# Patient Record
Sex: Male | Born: 1965 | Race: White | Hispanic: No | Marital: Married | Smoking: Current every day smoker
Health system: Southern US, Community
[De-identification: ages and names within clinical notes are randomized; demographics above are authoritative.]

## PROBLEM LIST (undated history)

## (undated) DIAGNOSIS — F1120 Opioid dependence, uncomplicated: Secondary | ICD-10-CM

## (undated) DIAGNOSIS — F1920 Other psychoactive substance dependence, uncomplicated: Secondary | ICD-10-CM

## (undated) HISTORY — PX: SPLENECTOMY, TOTAL: SHX788

## (undated) HISTORY — PX: APPENDECTOMY: SHX54

## (undated) HISTORY — PX: CHOLECYSTECTOMY: SHX55

---

## 1999-12-03 ENCOUNTER — Ambulatory Visit (HOSPITAL_COMMUNITY): Admission: RE | Admit: 1999-12-03 | Discharge: 1999-12-03 | Payer: Self-pay | Admitting: Unknown Physician Specialty

## 2000-01-12 ENCOUNTER — Ambulatory Visit (HOSPITAL_COMMUNITY): Admission: RE | Admit: 2000-01-12 | Discharge: 2000-01-12 | Payer: Self-pay | Admitting: Sports Medicine

## 2000-01-12 ENCOUNTER — Encounter: Payer: Self-pay | Admitting: Sports Medicine

## 2000-08-23 ENCOUNTER — Encounter: Payer: Self-pay | Admitting: Orthopedic Surgery

## 2000-08-25 ENCOUNTER — Encounter: Payer: Self-pay | Admitting: Orthopedic Surgery

## 2000-08-25 ENCOUNTER — Ambulatory Visit (HOSPITAL_COMMUNITY): Admission: RE | Admit: 2000-08-25 | Discharge: 2000-08-25 | Payer: Self-pay | Admitting: Orthopedic Surgery

## 2004-06-17 ENCOUNTER — Ambulatory Visit: Payer: Self-pay | Admitting: Psychiatry

## 2004-06-17 ENCOUNTER — Inpatient Hospital Stay (HOSPITAL_COMMUNITY): Admission: RE | Admit: 2004-06-17 | Discharge: 2004-06-24 | Payer: Self-pay | Admitting: Psychiatry

## 2006-08-10 ENCOUNTER — Emergency Department (HOSPITAL_COMMUNITY): Admission: EM | Admit: 2006-08-10 | Discharge: 2006-08-10 | Payer: Self-pay | Admitting: Emergency Medicine

## 2006-08-11 ENCOUNTER — Inpatient Hospital Stay (HOSPITAL_COMMUNITY): Admission: RE | Admit: 2006-08-11 | Discharge: 2006-08-17 | Payer: Self-pay | Admitting: Psychiatry

## 2006-08-11 ENCOUNTER — Ambulatory Visit: Payer: Self-pay | Admitting: Psychiatry

## 2009-03-24 ENCOUNTER — Emergency Department (HOSPITAL_BASED_OUTPATIENT_CLINIC_OR_DEPARTMENT_OTHER): Admission: EM | Admit: 2009-03-24 | Discharge: 2009-03-24 | Payer: Self-pay | Admitting: Emergency Medicine

## 2009-07-08 ENCOUNTER — Emergency Department (HOSPITAL_BASED_OUTPATIENT_CLINIC_OR_DEPARTMENT_OTHER): Admission: EM | Admit: 2009-07-08 | Discharge: 2009-07-08 | Payer: Self-pay | Admitting: Emergency Medicine

## 2009-07-10 ENCOUNTER — Telehealth: Payer: Self-pay | Admitting: Family

## 2009-07-10 ENCOUNTER — Ambulatory Visit: Payer: Self-pay | Admitting: Diagnostic Radiology

## 2009-07-10 ENCOUNTER — Ambulatory Visit: Payer: Self-pay | Admitting: Family

## 2009-07-10 ENCOUNTER — Ambulatory Visit (HOSPITAL_BASED_OUTPATIENT_CLINIC_OR_DEPARTMENT_OTHER): Admission: RE | Admit: 2009-07-10 | Discharge: 2009-07-10 | Payer: Self-pay | Admitting: Family Medicine

## 2009-07-10 DIAGNOSIS — M542 Cervicalgia: Secondary | ICD-10-CM

## 2009-07-10 DIAGNOSIS — M549 Dorsalgia, unspecified: Secondary | ICD-10-CM | POA: Insufficient documentation

## 2009-07-15 ENCOUNTER — Telehealth: Payer: Self-pay | Admitting: Family

## 2009-09-13 ENCOUNTER — Ambulatory Visit: Payer: Self-pay | Admitting: Family

## 2010-05-13 NOTE — Progress Notes (Signed)
Summary: pain clinic request  Phone Note Call from Patient   Caller: Patient Summary of Call: Pt would like to be referred to [facility] [place] #[phone] as it is closer to his home.  Mervin Kung CMA  July 10, 2009 1:47 PM  Initial call taken by: Lemont Fillers FNP,  July 10, 2009 1:50 PM  Follow-up for Phone Call        Pls note pt's request for specific pain clinic referral. Thanks Follow-up by: Lemont Fillers FNP,  July 10, 2009 1:50 PM  Additional Follow-up for Phone Call Additional follow up Details #1::        Unable to locate Pain Clinic patient requesting.  I s/w patient, he is aware & agreed that I refer him to [facility].  We are awaiting response about appt. Additional Follow-up by: Magdalen Spatz [facility],  July 12, 2009 1:57 PM

## 2010-05-13 NOTE — Progress Notes (Signed)
Summary: xray results  Phone Note Outgoing Call   Summary of Call: Please call patient and let him know that his neck x-ray is normal. There is note of some mild disc degeneration of the lumbar spine.  Also pls send patient the pt info re: payment plan at [facility] which may help him spread out or decrease his payment.  Alternatively, he could try [facility] as they work with patient's who are uninsured. Initial call taken by: Lemont Fillers FNP,  July 10, 2009 11:58 AM  Follow-up for Phone Call        Notified pt. of neck and back xray results.  Advised pt. I would mail info. to him re: payment plans through [facility].  Pt. states he has info. and numbers for [facility] in [facility] and [facility] in [place] if he determines he will not be able to continue his care with [place].  Mervin Kung CMA  July 10, 2009 1:35 PM

## 2010-05-13 NOTE — Progress Notes (Signed)
Summary: running low on meds--lm  Phone Note Call from Patient Call back at Home Phone [phone]   Caller: Patient Call For: Mehdi Gironda  Summary of Call: We have faxed information to Pain Management in [place] and have not heard back from them for an appt.  The patient called gave him their number to see if he calls them if they would go ahead with an appt for him.  He is running low on meds.   Initial call taken by: Roselle Locus,  July 15, 2009 3:42 PM  Follow-up for Phone Call        I agreed to provide patient with a one time prescription for narcotics.  X-rays are negative except for some mild disc degeneration in his lumbar spine.  Given these results I will defer any further narcotics to pain management when they see him.  In the meantime I recommend that the patient try mobic.  I have sent this to his pharmacy.   Follow-up by: Lemont Fillers FNP,  July 15, 2009 4:08 PM  Additional Follow-up for Phone Call Additional follow up Details #1::        Left message on machine to return call.   Mervin Kung CMA  July 16, 2009 11:50 AM   Notified pt. per Efraim Kaufmann that she would not be filling any further rx for narcotic pain meds.  He would need to get further rx from pain management.  Pt. could try Mobic. Pt states his appt. with pain management in 07/31/09 and he would just take Ibuprofen as Mobic didn't seem to help.  Nicki Guadalajara Fergerson CMA  July 17, 2009 8:39 AM     New/Updated Medications: MOBIC 7.5 MG TABS (MELOXICAM) one tablet by mouth daily as needed pain Prescriptions: MOBIC 7.5 MG TABS (MELOXICAM) one tablet by mouth daily as needed pain  #30 x 0   Entered and Authorized by:   Lemont Fillers FNP   Signed by:   Lemont Fillers FNP on 07/15/2009   Method used:   Electronically to        [facility] (retail)       [phone] [address]       [place], [place]  [id number]       Ph: [phone]       Fax: [phone]  RxID:   [id number]

## 2010-05-13 NOTE — Assessment & Plan Note (Signed)
Summary: new to est er follow up/mhf   Vital Signs:  Patient profile:   45 year old male Height:      67 inches Weight:      161.38 pounds BMI:     25.37 Temp:     97.5 degrees F oral Pulse rate:   72 / minute Pulse rhythm:   regular Resp:     12 per minute BP sitting:   118 / 88  (right arm) Cuff size:   regular  Vitals Entered By: Mervin Kung CMA (July 10, 2009 9:13 AM)  Is Patient Diabetic? No   History of Present Illness: Mr Kenneth Anderson is a 45 year old male who presents today to establish care.  Tells me that he felt a "huge pop" in his neck while trying to move a dining table at work.  This occured on 3/28. He works for [employer].  He was seen over at the [facility] ED.  He notes that he has a he has history of chronic neck and back pain, but notes that his neck pain acutely worsened on 3/28 following this injury at work.  He was prescribed prednisone, ibuprofen, valium and oxycodone in the ED. Patient reports that he has pain at the base of his neck which radiates to his shoulders and up his head causing a headache.  Also notes back pain in his lower back which occasionally radiates down both legs.  Notes that his feet occasionally go "numb".  Notes that his legs will "buckle" for now reason at all.  Though denies LE weakness.  Denies UE weakness.  Does note some finger numbness at times which seems positional.  Notes that these medications are not helping his pain.       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 Resp: Denies cough CV:  Denies Chest Pain GI:  Denies nausea or vomitting GU: Denies dysuria Lymphatic: Denies lymphadenopathy Musculoskeletal:  see HPI Skin:  Denies Rashes Psychiatric: notes + depression which is situational related to back pain and lack of insurance, has been treated in the past, declines meds at this time. Neuro: see HPI    Physical Exam  General:  Well-developed,well-nourished,in no acute distress; alert,appropriate and cooperative throughout examination Lungs:  Normal respiratory effort, chest expands symmetrically. Lungs are clear to auscultation, no crackles or wheezes. Heart:  Normal rate and regular rhythm. S1 and S2 normal without gallop, murmur, click, rub or other extra sounds.   Impression & Recommendations:  Problem # 1:  NECK PAIN (ICD-723.1) Assessment New Will check plain films of neck.  I will refer to pain clinic  given acute on chronic nature of his pain.  I did tell the patient that I will give him a one time prescription for oxycodone and then defer future pain management to pain clinic.   His updated medication list for this problem includes:    Aleve 220 Mg Tabs (Naproxen sodium) ..... One tablet by mouth two times a day with food as needed for pain    Oxycodone-acetaminophen 7.5-325 Mg Tabs (Oxycodone-acetaminophen) .Marland Kitchen... 1  tablet every 6 hours as needed.  Orders: T-Cervical Spine Comp 4 Views (72050TC) Pain Clinic Referral (Pain)  Problem # 2:  BACK PAIN (ICD-724.5) Assessment: New Will check plain films of back.   His updated medication list for this problem includes:    Aleve 220 Mg Tabs (Naproxen sodium) ..... One tablet by mouth two times a day with food as needed for pain    Oxycodone-acetaminophen 7.5-325 Mg Tabs (Oxycodone-acetaminophen) .Marland Kitchen... 1 tablet every 6 hours as needed.  Orders: T-Thoracic Spine 2 Views [id number]) T-Lumbar Spine 2 Views (72100TC) Pain Clinic Referral (Pain)  Complete Medication List: 1)  Aleve 220 Mg Tabs (Naproxen sodium) .... One tablet by mouth two times a day with food as needed for pain 2)  Valium 5 Mg Tabs (Diazepam) .Marland Kitchen.. 1 tablet every 6 hours as  needed 3)  Oxycodone-acetaminophen 7.5-325 Mg Tabs (Oxycodone-acetaminophen) .Marland Kitchen.. 1 tablet every 6 hours as needed. 4)  Prednisone 20 Mg Tabs (Prednisone) .... As directed  Patient Instructions: 1)  Please complete x-rays today. 2)  Follow up in 2 weeks. Prescriptions: OXYCODONE-ACETAMINOPHEN 7.5-325 MG TABS (OXYCODONE-ACETAMINOPHEN) 1 tablet every 6 hours as needed.  #30 x 0   Entered and Authorized by:   Lemont Fillers FNP   Signed by:   Lemont Fillers FNP on 07/10/2009   Method used:   Print then Give to Patient   RxID:   [id number]    Vital Signs:  Patient Profile:   45 year old male Height:     67 inches Weight:      161.38 pounds BMI:     25.37 Temp:     97.5 degrees F oral Pulse rate:   72 / minute Pulse rhythm:   regular Resp:     12 per minute BP sitting:   118 / 88 Cuff size:   regular                 Current Allergies (reviewed today): No known allergies

## 2010-07-07 ENCOUNTER — Emergency Department (HOSPITAL_BASED_OUTPATIENT_CLINIC_OR_DEPARTMENT_OTHER)
Admission: EM | Admit: 2010-07-07 | Discharge: 2010-07-07 | Disposition: A | Discharge status: Home / Self Care | Payer: Self-pay | Attending: Emergency Medicine | Admitting: Emergency Medicine

## 2010-07-07 DIAGNOSIS — F17200 Nicotine dependence, unspecified, uncomplicated: Secondary | ICD-10-CM | POA: Insufficient documentation

## 2010-07-07 DIAGNOSIS — K047 Periapical abscess without sinus: Secondary | ICD-10-CM | POA: Insufficient documentation

## 2010-08-29 NOTE — H&P (Signed)
NAME:  Kenneth Anderson, Kenneth Anderson NO.:  [id number]   MEDICAL RECORD NO.:  [id number]          PATIENT TYPE:  IPS   LOCATION:  0500                          FACILITY:  [facility]   PHYSICIAN:  Geoffery Lyons, M.D.      DATE OF BIRTH:  Mar 26, 1966   DATE OF ADMISSION:  08/11/2006  DATE OF DISCHARGE:                       PSYCHIATRIC ADMISSION ASSESSMENT   A 45 year old, white male voluntarily admitted on August 11, 2006.   HISTORY OF PRESENT ILLNESS:  The patient presents with a history of  alcohol abuse and cocaine use.  He states he has been drinking too  much, using cocaine on occasion, with some depression, and suicidal  thoughts.  No specific plan.  The patient has been drinking 6-8 beers  daily.  Has been using cocaine for approximately 1 month.  No IV drug  use.  Denies any other drug use.   PAST PSYCHIATRIC HISTORY:  The patient was here in 2006 for suicidal  thoughts and substance abuse.  He has a history of bipolar disorder.  He  sees Dr. Micheline Maze for outpatient psychiatric care.   SOCIAL HISTORY:  A 45 year old, white male who appears to be unemployed.   FAMILY HISTORY:  None known.   ALCOHOL/DRUG HISTORY:  As above.  The patient apparently has had a  seizure with cocaine use in the past.   PRIMARY CARE Tahesha Skeet:  None listed.   MEDICAL PROBLEMS:  Per chart.  Again a history of a seizure after  cocaine use and a history of __________.   MEDICATIONS:  He has been on Depakote 500 mg daily and Celexa 20 mg  daily.  Some questionable noncompliance as his Depakote level is less  than 10.   DRUG ALLERGIES:  NO KNOWN ALLERGIES.   PHYSICAL EXAMINATION:  The patient was fully assessed at [facility]  Emergency Department.  His temperature is 97.7, 102 heart rate, 20  respirations, blood pressure 134/89, 97% saturated, 71.82 kg.  His  alcohol level is 14.  BMET is within normal limits.  WBC count is  elevated at 15.9.  Urine drug screen is positive for cocaine and  positive  for cannabis.  Salicylate  level less than 4.  Valproic acid  level less than 10.  Urinalysis was negative.   PHYSICAL FINDINGS:  GENERAL:  This is a young male in no acute distress.  Denies any complaints.  He is resting in bed.  MENTAL STATUS EXAM:  The patient is resting in bed.  His eyes are  closed.  The patient provides only a few words.  The patient feels  tired.  The patient does appear sleepy.  Thought processes are coherent.  He can provide brief answers, but answers are goal directed and no  evidence of delusional thinking.  Cognitive function intact.  His memory  is fair.  Judgment and insight is fair.  Poor historian.   AXIS I:  Alcohol abuse, rule out dependence, cocaine abuse, depressive  disorder not otherwise specified.  AXIS II:  Deferred.  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  AXIS IV:  Problems with primary support group, other psychosocial  problems, possible problems related to housing.  AXIS V:  Current is 30, 35.   PLAN:  Contract for safety.  Stabilize his mood and thinking.  We will  resume his medications.  I will monitor Depakote level, put the patient  on a Librium protocol, and work on relapse prevention.  The patient is  to attend group therapy.  Case manager is to assess his living situation  and potential rehab programs.  We will reinforce medication compliance  and consider family session with the patient's support group.  We will  continue to obtain more information as the patient is able to interview  more readily.  Tentative length of stay is 3-5 days.      Landry Corporal, N.P.      Geoffery Lyons, M.D.  Electronically Signed    JO/MEDQ  D:  08/11/2006  T:  08/11/2006  Job:  [id number]
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
## 2010-08-29 NOTE — Discharge Summary (Signed)
NAME:  Kenneth Anderson, LANDSTROM NO.:  [id number]   MEDICAL RECORD NO.:  [id number]          PATIENT TYPE:  IPS   LOCATION:  0500                          FACILITY:  [facility]   PHYSICIAN:  Geoffery Lyons, M.D.      DATE OF BIRTH:  May 05, 1965   DATE OF ADMISSION:  08/11/2006  DATE OF DISCHARGE:  08/17/2006                               DISCHARGE SUMMARY   CHIEF COMPLAINT AND PRESENT ILLNESS:  This was the second admission to  [facility] for this 45 year old white male voluntarily  admitted.  A history of alcohol abuse and cocaine abuse.  He claimed he  had been drinking too much, using cocaine on occasion, with some  depression and suicidal thoughts.  No specific plan.  He has been  drinking 6-8 beers.  Also using cocaine for approximately 1 month.  He  denied any other drug use.   PAST PSYCHIATRIC HISTORY:  He was admitted to this unit in 2006 for  suicidal thoughts and substance abuse.  Diagnosed with bipolar disorder.  Seeing Dr. Micheline Maze in an outpatient clinic.   SECONDARY HISTORY:  Persistent use of alcohol and cocaine.  Has had a  seizure secondary to cocaine use in the past.   MEDICAL HISTORY:  Noncontributory.   MEDICATIONS:  1. Depakote 500 mg per day.  2. Celexa 20 mg per day.  3. Questionable compliance with Depakote level was less than 10.   PHYSICAL EXAMINATION:  Performed, it failed to show any acute findings.   LABORATORY WORKUP:  SGOT 25, SGPT 19, total bilirubin 1.1.  BMET within  normal limits.  White blood cells 15.9.  Urine drug screen positive for  cocaine and marijuana.  Valproic acid level less than 10.   ADMITTING DIAGNOSES:  AXIS I:  Depressive disorder, not otherwise  specified.  Alcohol, cocaine, marijuana abuse.  AXIS II:  No diagnosis.  AXIS III:  No diagnosis.  AXIS IV:  Moderate.  AXIS V:  Upon admission, 35; highest global assessment of functioning in  the last year 60.   COURSE IN THE HOSPITAL:  He was admitted.  He was  started in individual  and group psychotherapy.  He had been on Celexa 20 mg per day and  Depakote 500 mg per day.  We went ahead and detoxed with Librium, used  trazodone for sleep, and started him on Depakote 500 twice daily and  Celexa 20 mg per day.  Endorsed he said that he was very depressed,  tired of drinking alcohol 6-10 beers every day for several weeks,  cocaine once or twice per month, marijuana every day.  He endorsed  anxiety.  He had been diagnosed bipolar.  He had been on Seroquel,  Celexa, Klonopin.  He works in Building [employer].  Over the last 5 months, he  endorsed that he likes his job.  Divorced.  He has three children; 16,  8, and 3.  The main cause of the divorce was his addiction.  There was a  family session with his fiancee.  She seemed to be supportive, concerned  about what kind of followup he was  going to have and claimed that he was  going to be compliant with treatment.  On May 4, he was feeling better;  it seemed that Librium that we were using for detox was very sedating.  Once he was off the Librium, he was overall even better.  Depakote was  switched to bedtime only.  He continued to improve.  On May 6, he was in  full contact with reality.  There were no active suicidal or homicidal  ideas, no hallucinations or delusions.  There was no withdrawal.  He was  pretty clear cognitively-wise.  He endorsed that he was going to be  committed to abstain from using any substances and going to stay on his  medication.   DISCHARGE DIAGNOSES:  AXIS I:  Alcohol, cocaine, marijuana abuse,  bipolar disorder.  AXIS II:  No diagnosis.  AXIS III:  No diagnosis.  AXIS IV:  Moderate.  AXIS V:  Upon discharge, 50.  Discharged on Celexa 20 mg per day,  Seroquel 100 mg at night, and trazodone 100 mg at night.  Followup  Tiffany __________ [facility] in [place].      Geoffery Lyons, M.D.  Electronically Signed     IL/MEDQ  D:  09/10/2006  T:  09/11/2006  Job:  [id number]

## 2010-08-29 NOTE — Discharge Summary (Signed)
NAME:  Kenneth Anderson, Kenneth Anderson NO.:  [id number]   MEDICAL RECORD NO.:  [id number]          PATIENT TYPE:  IPS   LOCATION:  0502                          FACILITY:  [facility]   PHYSICIAN:  Geoffery Lyons, M.D.      DATE OF BIRTH:  1965-10-14   DATE OF ADMISSION:  06/17/2004  DATE OF DISCHARGE:  06/24/2004                                 DISCHARGE SUMMARY   CHIEF COMPLAINT AND PRESENT ILLNESS:  This was the first admission to [facility] for this 45 year old married white male voluntarily  admitted.  History of crack cocaine.  Relapsed about two weeks prior to this  admission after being clean for five months.  Has been drinking alcohol ever  since Sunday.  Has been feeling depressed and having suicidal thoughts with  a plan to cut himself, hang himself or throw himself into traffic.  His  deterrent was his family.  History of alcohol and drugs for approximately  two years.  Has not been sleeping since wife kicked him out of the house  because of his substance use.   PAST PSYCHIATRIC HISTORY:  First time at [facility].  History of  bipolar disorder, type 2.  Outpatient at [facility].   ALCOHOL/DRUG HISTORY:  As already stated, persistent use of substances.  Drinking 4-5 beers a day, drinking in the morning, denied any withdrawal,  using crack cocaine.   MEDICAL HISTORY:  Seizures after cocaine use, ___________, history of  splenectomy.   MEDICATIONS:  Had been on lithium and Seroquel, noncompliant.   PHYSICAL EXAMINATION:  Performed and failed to show any acute findings.   LABORATORY DATA:  Urine drug screen positive for cocaine and marijuana.  CBC  within normal limits.  Liver enzymes within normal limits.  TSH 1.303.  Lithium level less than 0.25.   MENTAL STATUS EXAM:  Alert, cooperative male with little eye contact.  Casually dressed.  Speech was soft-spoken, not pressured, normal rate and  tone.  Feeling hopeless, helpless, sad, some mild  irritability.  Thought  processes were logical, coherent and relevant.  No evidence of psychosis.  Answers are goal directed.  Cognition was well-preserved.   ADMISSION DIAGNOSES:   AXIS I:  1.  Bipolar disorder, type 2, depressed.  2.  Polysubstance abuse.   AXIS II:  No diagnosis.   AXIS III:  1.  ___________.  2.  Status post splenotomy.  3.  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   AXIS IV:  Moderate.   AXIS V:  Global Assessment of Functioning upon admission 30; highest Global  Assessment of Functioning in the last year 65.   HOSPITAL COURSE:  He was admitted and started in individual and group  psychotherapy.  He was given Ambien for sleep.  He was given Seroquel 100 mg  at night.  He was given Seroquel 50 mg every six hours as needed, Librium 25  mg every six hours for withdrawal, Symmetrel 100 mg twice a day.  He was  given trazodone 50 mg at night and Vistaril 25 mg every four hours as  needed.  Ambien was discontinued.  Placed on Eskalith 450 mg daily.  Seroquel was increased to 150 mg at night and he was given Lexapro 5 mg at  night.  Admitted depressed and suicidal, arguing with his wife with no place  to live.  Suicidal ideation but no plan.  Seroquel was adjusted further.  He  was endorsing irritability, racing thoughts, mood swings, decreased sleep.  Seroquel was increased to 250 mg per night and 50 mg twice a day.  Still  difficult time with sleep.  Complaining of being irritable and having  difficulty with nerves.  He was started on Neurontin 300 mg every three  hours.  Feeling that the Neurontin helped.  He was able to sleep better.  Less jerking.  On March 13th, he was still sleeping well.  Wanting to go to  a residential treatment center as he felt he was not going to be able to  make it.  He was placed on a waiting list for [facility] and [facility].  On  March 14th, he was in full contact with reality.  Endorsed no suicidal  ideation, no homicidal ideation, no  hallucinations, no delusions.  Insightful.  One of the losses due to his substance use.  Was going to be  staying with his pastor.  Was going to wait for a bed at [facility].  We continued  outpatient treatment.  He was willing and motivated to pursue further work  on his abstinence.   DISCHARGE DIAGNOSES:   AXIS I:  1.  Bipolar disorder, type 2, depressed.  2.  Polysubstance abuse.   AXIS II:  No diagnosis.   AXIS III:  1.  __________.  2.  Status post splenectomy.  3.  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   AXIS IV:  Moderate.   AXIS V:  Global Assessment of Functioning upon discharge 55.   DISCHARGE MEDICATIONS:  1.  Symmetrel 100 mg twice a day.  2.  Eskalith CR 450 mg, 1 twice a day.  3.  Lexapro 10 mg per day.  4.  Seroquel 25 mg, 2 twice a day.  5.  Neurontin 300 mg, 1 three times a day and 2 at night.  6.  Seroquel 300 mg at night.   FOLLOW UP:  [facility].      IL/MEDQ  D:  07/16/2004  T:  07/16/2004  Job:  [id number]

## 2010-08-29 NOTE — H&P (Signed)
Kenneth Anderson, Kenneth Anderson           ACCOUNT NO.:  [id number]   MEDICAL RECORD NO.:  [id number]          PATIENT TYPE:  IPS   LOCATION:  0502                          FACILITY:  [facility]   PHYSICIAN:  Syed T. Arfeen, M.D.   DATE OF BIRTH:  08-Jun-1965   DATE OF ADMISSION:  06/17/2004  DATE OF DISCHARGE:                         PSYCHIATRIC ADMISSION ASSESSMENT   IDENTIFYING INFORMATION:  Patient is a 45 year old married white male  voluntarily admitted on 06/17/04.   HISTORY OF PRESENT ILLNESS:  Patient presents with a history of crack  cocaine use, relapsed about 2 weeks ago after being clean for 5 months.  He  also has been drinking alcohol ever since Sunday.  He has been feeling  depressed and having suicidal thoughts with a plan to cut himself, hang  himself or throw himself into traffic.  Patient states that his deterrent  was his family.  He has a history of alcohol and drug use for approximately  2 years.  He has not been sleeping.  His appetite has been satisfactory.  He  has lost about 2 pounds.  He denies any psychotic symptoms.  He states his  wife kicked him out of the house because of his substance abuse.  Patient is  currently seeking help.   PAST PSYCHIATRIC HISTORY:  First admission to [facility]  He  has a history of bipolar II and is an outpatient at [facility].  His longest history of sobriety has been 5 months.  He has no  history of being detoxed prior.   SOCIAL HISTORY:  This is a 45 year old married white male, married for 15  years with 3 children, ages 73, 67 and 2 months old.  He lives with his wife  and 3 children.  He has been unemployed for the past 2 weeks.  He was fired  from his position.  He was doing Geneticist, molecular.   FAMILY HISTORY:  Unclear.   ALCOHOL OR DRUG HISTORY:  Patient smokes.  He has been drinking 4-5 beers a  day.  Drinks in the morning.  He denies any withdrawal symptoms.  He has no  history of seizures.   He denies any IV drug use and has been using crack  cocaine.   PRIMARY CARE Destinee Taber:  None.   MEDICAL PROBLEMS:  Spherocytosis which he says is an abnormality of his  white blood count.  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  He also reports a  history of seizures after cocaine use.  Patient is currently on any  medications in regards to this and did not followup in regards to seizure  activity.   MEDICATIONS:  He has been on lithium and Seroquel, and has been noncompliant  with his medicines.   DRUG ALLERGIES:  No known drug allergies.   PHYSICAL EXAMINATION:  Patient was assessed at [facility] where he received thiamine injection.  He is in no acute distress  today.  His temperature is 98.6, heart rate 108, respirations 16, blood  pressure 111/68, 5 feet 7 inches tall.  He is 163 pounds.  LABORATORY DATA:  His urine drug screen is positive for cocaine, positive  for THC.  TSH is 1.303.  CBC shows a platelet count elevated at 430.  Liver  enzymes are within normal limits.  Urinalysis shows positive nitrates.  WBC  count is 0-2.  He also has trace ketones.   MENTAL STATUS EXAM:  He is an alert, cooperative male.  Little eye contact.  He is casually dressed.  His speech is soft spoken.  No pressure, normal  rate and tone.  Patient feels hopeless.  The patient is sad and shows some  mild irritability.  Thought processes are coherent.  There appears to be no  evidence of psychosis.  His answers are goal directed.  Cognitive function  intact.  Memory is fair.  Judgment is fair.  Insight is limited.  Poor  impulse control.   ADMISSION DIAGNOSES:   AXIS I:  1.  Bipolar II, depressed.  2.  Polysubstance abuse, rule out dependence.   AXIS II:  Deferred.   AXIS III:  Spherocytosis.   AXIS IV:  Problems with primary support group, housing, occupation, other  psychosocial problems.   AXIS V:  Current is 30, estimated in the past year 82.   PLAN:   Stabilize mood and thinking.  We will detox off alcohol, monitor  withdrawal symptoms.  Put patient on Symmetrel for cocaine abuse.  Work on  relapse prevention.  Patient is to increase coping skills.  We will resume  his lithium and Seroquel, reinforce medication compliance, will monitor  levels of lithium for therapeutic range.  We will have a family session with  wife for support.  Patient is to follow up with AA, NA and consider the CD  IOP program.  Tentative length of stay is 4-6 days.      JO/MEDQ  D:  06/20/2004  T:  06/20/2004  Job:  [id number]

## 2011-04-04 IMAGING — CR DG LUMBAR SPINE 2-3V
3 series · 3 of 3 positions shown · non-contrast
Comparison: None.

CLINICAL DATA: Back pain.  Lifting injury.

LUMBAR SPINE - 2-3 VIEW

[t l-spine a.p.]
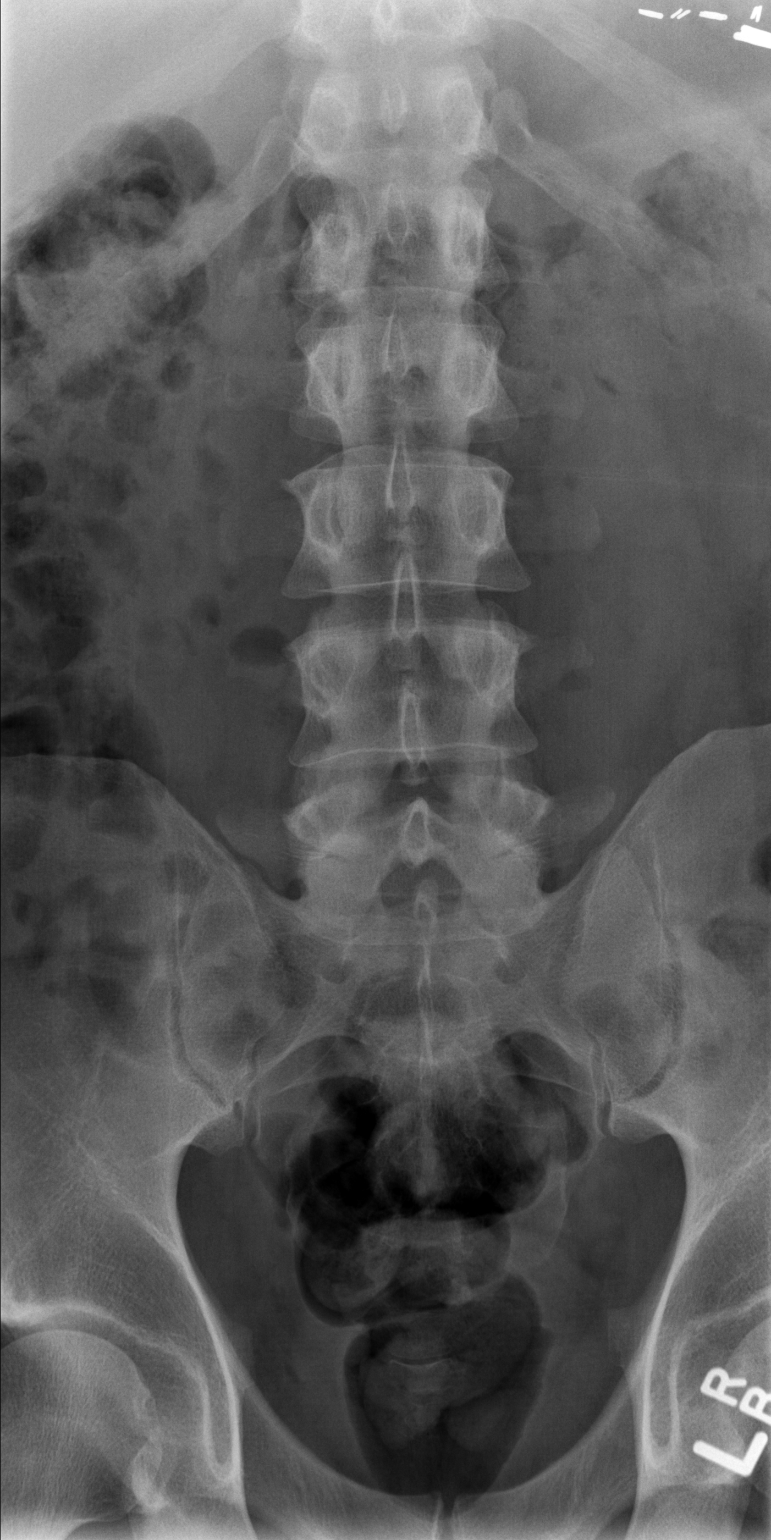

[t l-spine lat]
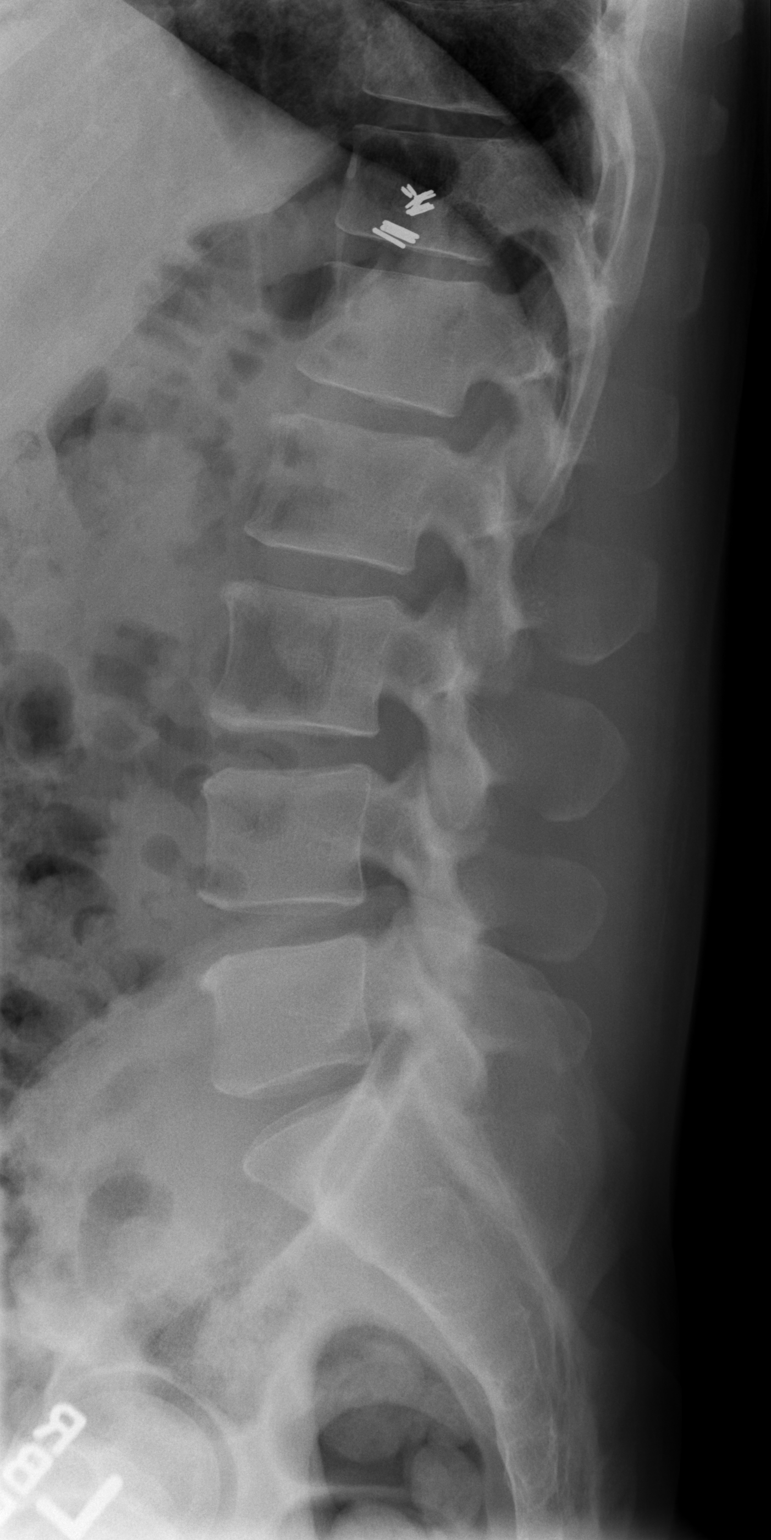

[t l-spine l5-s1 spot]
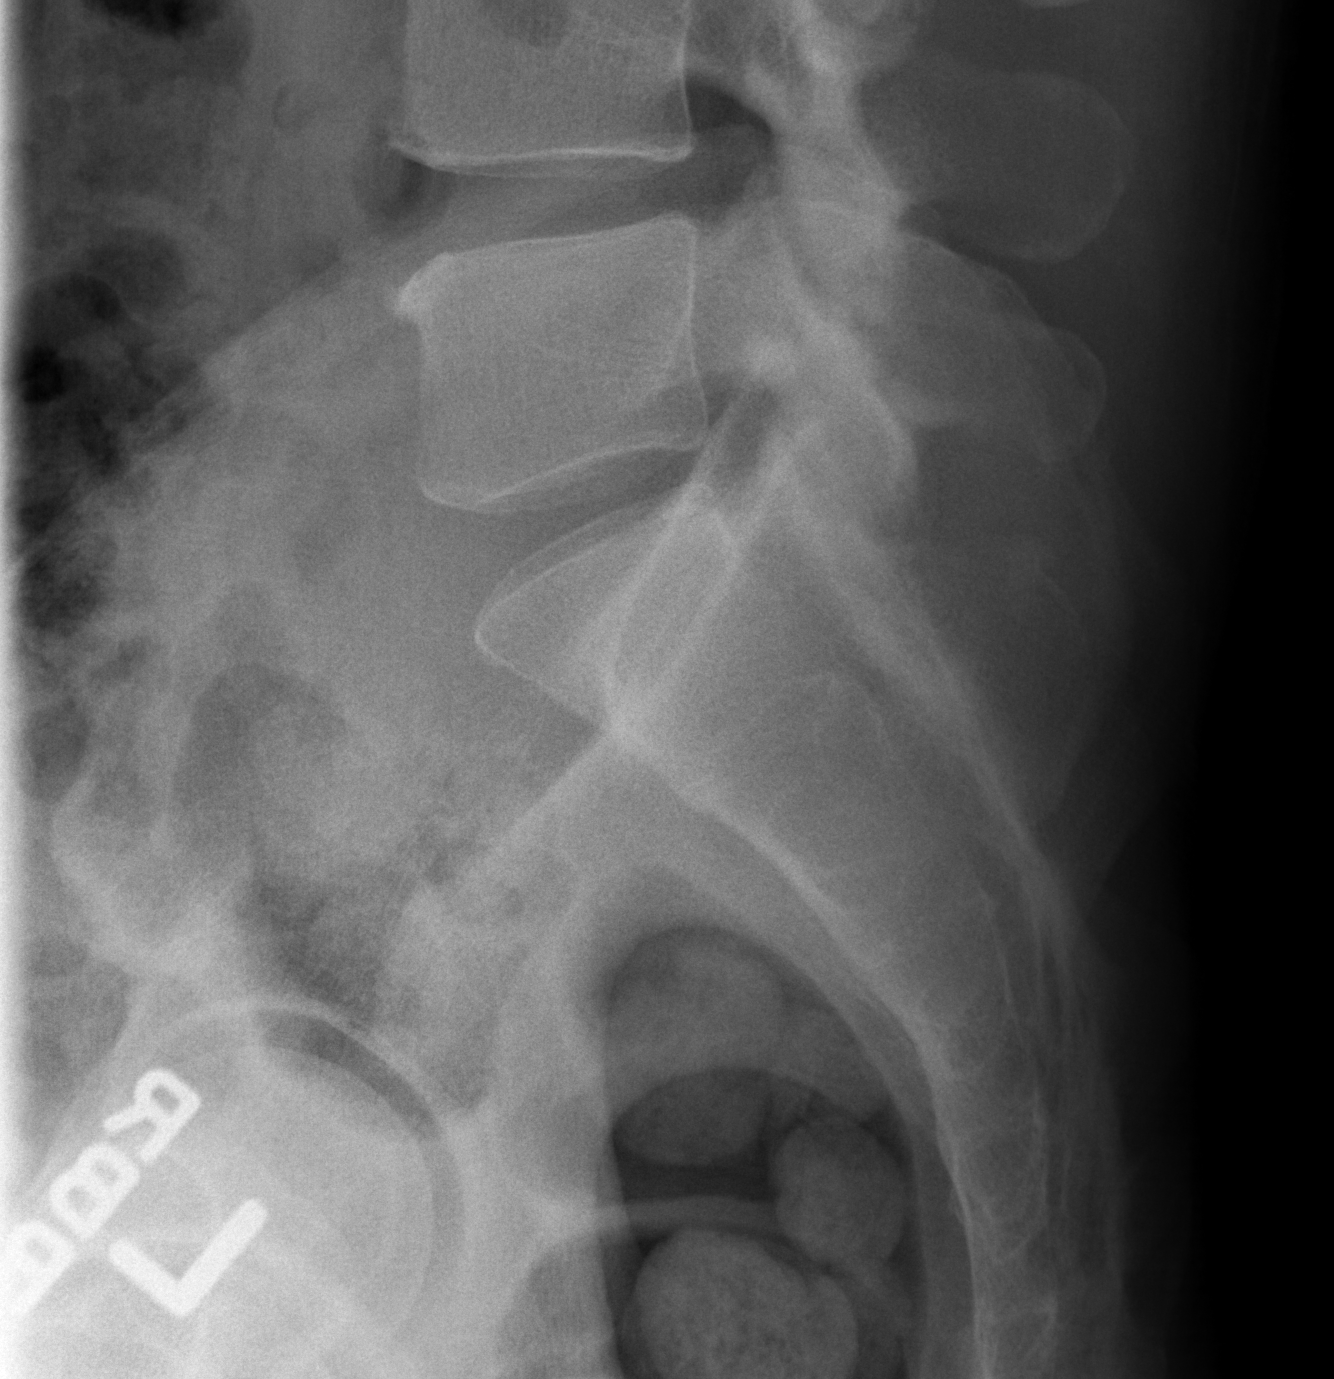

[3 of 3 positions shown; findings below may reference images not displayed]

FINDINGS: Normal alignment and no fracture.  Mild lumbar disc
degeneration with disc space narrowing at L3-4 and  L4-5.
IMPRESSION: No acute abnormality.  Mild degenerative change.
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
## 2013-04-07 ENCOUNTER — Encounter (HOSPITAL_BASED_OUTPATIENT_CLINIC_OR_DEPARTMENT_OTHER): Payer: Self-pay | Admitting: Emergency Medicine

## 2013-04-07 ENCOUNTER — Emergency Department (HOSPITAL_BASED_OUTPATIENT_CLINIC_OR_DEPARTMENT_OTHER)
Admission: EM | Admit: 2013-04-07 | Discharge: 2013-04-07 | Disposition: A | Discharge status: Home / Self Care | Payer: No Typology Code available for payment source | Attending: Emergency Medicine | Admitting: Emergency Medicine

## 2013-04-07 DIAGNOSIS — S139XXA Sprain of joints and ligaments of unspecified parts of neck, initial encounter: Secondary | ICD-10-CM | POA: Insufficient documentation

## 2013-04-07 DIAGNOSIS — T148XXA Other injury of unspecified body region, initial encounter: Secondary | ICD-10-CM

## 2013-04-07 DIAGNOSIS — Y9389 Activity, other specified: Secondary | ICD-10-CM | POA: Insufficient documentation

## 2013-04-07 DIAGNOSIS — F17200 Nicotine dependence, unspecified, uncomplicated: Secondary | ICD-10-CM | POA: Insufficient documentation

## 2013-04-07 DIAGNOSIS — Y92410 Unspecified street and highway as the place of occurrence of the external cause: Secondary | ICD-10-CM | POA: Insufficient documentation

## 2013-04-07 DIAGNOSIS — S0990XA Unspecified injury of head, initial encounter: Secondary | ICD-10-CM | POA: Insufficient documentation

## 2013-04-07 MED ORDER — TRAMADOL HCL 50 MG PO TABS: 50.0000 mg | ORAL_TABLET | Freq: Four times a day (QID) | ORAL | Status: DC | PRN

## 2013-04-07 MED ORDER — IBUPROFEN 800 MG PO TABS: 800.0000 mg | ORAL_TABLET | Freq: Three times a day (TID) | ORAL | Status: DC

## 2013-04-07 MED ORDER — CYCLOBENZAPRINE HCL 10 MG PO TABS: 10.0000 mg | ORAL_TABLET | Freq: Two times a day (BID) | ORAL | Status: DC | PRN

## 2013-04-07 MED ORDER — TRAMADOL HCL 50 MG PO TABS
50.0000 mg | ORAL_TABLET | Freq: Once | ORAL | Status: AC
  Administered 2013-04-07: 50 mg via ORAL
  Filled 2013-04-07: qty 1

## 2013-04-07 MED ORDER — CYCLOBENZAPRINE HCL 10 MG PO TABS
5.0000 mg | ORAL_TABLET | Freq: Once | ORAL | Status: AC
  Administered 2013-04-07: 5 mg via ORAL
  Filled 2013-04-07: qty 1
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
## 2013-04-08 NOTE — ED Provider Notes (Signed)
Medical screening examination/treatment/procedure(s) were performed by non-physician practitioner and as supervising physician I was immediately available for consultation/collaboration.   Jessia Kief M Zeola Brys, MD 04/08/13 1333 

## 2014-08-21 ENCOUNTER — Encounter (HOSPITAL_BASED_OUTPATIENT_CLINIC_OR_DEPARTMENT_OTHER): Payer: Self-pay | Admitting: Emergency Medicine

## 2014-08-21 ENCOUNTER — Emergency Department (HOSPITAL_BASED_OUTPATIENT_CLINIC_OR_DEPARTMENT_OTHER)
Admission: EM | Admit: 2014-08-21 | Discharge: 2014-08-21 | Discharge status: Against Medical Advice | Payer: Self-pay | Attending: Emergency Medicine | Admitting: Emergency Medicine

## 2014-08-21 DIAGNOSIS — Z720 Tobacco use: Secondary | ICD-10-CM | POA: Insufficient documentation

## 2014-08-21 DIAGNOSIS — R1030 Lower abdominal pain, unspecified: Secondary | ICD-10-CM | POA: Insufficient documentation

## 2014-08-21 NOTE — ED Notes (Signed)
Pt in c/o groin pain onset this am. States no inflammation, but area feels like there's "knots" and the area burns. States may be possible hernia.

## 2016-11-24 ENCOUNTER — Emergency Department (HOSPITAL_COMMUNITY)
Admission: EM | Admit: 2016-11-24 | Discharge: 2016-11-25 | Disposition: A | Discharge status: Home / Self Care | Payer: Medicaid Other | Attending: Physician Assistant | Admitting: Physician Assistant

## 2016-11-24 ENCOUNTER — Encounter (HOSPITAL_COMMUNITY): Payer: Self-pay | Admitting: Emergency Medicine

## 2016-11-24 DIAGNOSIS — F1994 Other psychoactive substance use, unspecified with psychoactive substance-induced mood disorder: Secondary | ICD-10-CM | POA: Diagnosis present

## 2016-11-24 DIAGNOSIS — F17210 Nicotine dependence, cigarettes, uncomplicated: Secondary | ICD-10-CM | POA: Insufficient documentation

## 2016-11-24 DIAGNOSIS — L089 Local infection of the skin and subcutaneous tissue, unspecified: Secondary | ICD-10-CM | POA: Diagnosis present

## 2016-11-24 DIAGNOSIS — R45851 Suicidal ideations: Secondary | ICD-10-CM | POA: Diagnosis not present

## 2016-11-24 DIAGNOSIS — F1924 Other psychoactive substance dependence with psychoactive substance-induced mood disorder: Secondary | ICD-10-CM | POA: Diagnosis not present

## 2016-11-24 DIAGNOSIS — F332 Major depressive disorder, recurrent severe without psychotic features: Secondary | ICD-10-CM | POA: Diagnosis present

## 2016-11-24 HISTORY — DX: Other psychoactive substance dependence, uncomplicated: F19.20

## 2016-11-24 HISTORY — DX: Opioid dependence, uncomplicated: F11.20

## 2016-11-24 LAB — COMPREHENSIVE METABOLIC PANEL
ALT: 24 U/L (ref 17–63)
AST: 16 U/L (ref 15–41)
Albumin: 4.1 g/dL (ref 3.5–5.0)
Alkaline Phosphatase: 70 U/L (ref 38–126)
Anion gap: 8 (ref 5–15)
BILIRUBIN TOTAL: 1.3 mg/dL — AB (ref 0.3–1.2)
BUN: 11 mg/dL (ref 6–20)
CO2: 28 mmol/L (ref 22–32)
Calcium: 9.3 mg/dL (ref 8.9–10.3)
Chloride: 100 mmol/L — ABNORMAL LOW (ref 101–111)
Creatinine, Ser: 0.98 mg/dL (ref 0.61–1.24)
GFR calc Af Amer: >60 mL/min (ref >60)
Glucose, Bld: 98 mg/dL (ref 65–99)
Potassium: 3.9 mmol/L (ref 3.5–5.1)
Sodium: 136 mmol/L (ref 135–145)
TOTAL PROTEIN: 7.1 g/dL (ref 6.5–8.1)

## 2016-11-24 LAB — ACETAMINOPHEN LEVEL: Acetaminophen (Tylenol), Serum: <10 ug/mL — ABNORMAL LOW (ref 10–30)

## 2016-11-24 LAB — CBC
HEMATOCRIT: 42.1 % (ref 39.0–52.0)
Hemoglobin: 14.9 g/dL (ref 13.0–17.0)
MCH: 33.6 pg (ref 26.0–34.0)
MCHC: 35.4 g/dL (ref 30.0–36.0)
MCV: 95 fL (ref 78.0–100.0)
PLATELETS: 396 10*3/uL (ref 150–400)
RBC: 4.43 MIL/uL (ref 4.22–5.81)
RDW: 12.8 % (ref 11.5–15.5)
WBC: 8.6 10*3/uL (ref 4.0–10.5)

## 2016-11-24 LAB — SALICYLATE LEVEL: Salicylate Lvl: <7 mg/dL (ref 2.8–30.0)

## 2016-11-24 LAB — ETHANOL: Alcohol, Ethyl (B): <5 mg/dL (ref <5)

## 2016-11-24 LAB — RAPID URINE DRUG SCREEN, HOSP PERFORMED
AMPHETAMINES: POSITIVE — AB
BENZODIAZEPINES: NOT DETECTED
Barbiturates: NOT DETECTED
Cocaine: NOT DETECTED
Opiates: NOT DETECTED
TETRAHYDROCANNABINOL: POSITIVE — AB

## 2016-11-24 MED ORDER — IBUPROFEN 200 MG PO TABS
600.0000 mg | ORAL_TABLET | Freq: Three times a day (TID) | ORAL | Status: DC | PRN
  Administered 2016-11-25: 600 mg via ORAL
  Filled 2016-11-24: qty 1

## 2016-11-24 MED ORDER — CEPHALEXIN 250 MG PO CAPS: 500.0000 mg | ORAL_CAPSULE | Freq: Four times a day (QID) | ORAL | Status: DC

## 2016-11-24 MED ORDER — CEPHALEXIN 250 MG PO CAPS
500.0000 mg | ORAL_CAPSULE | Freq: Four times a day (QID) | ORAL | Status: DC
  Administered 2016-11-24 – 2016-11-25 (×4): 500 mg via ORAL
  Filled 2016-11-24 (×4): qty 2

## 2016-11-24 MED ORDER — ONDANSETRON HCL 4 MG PO TABS: 4.0000 mg | ORAL_TABLET | Freq: Three times a day (TID) | ORAL | Status: DC | PRN

## 2016-11-24 MED ORDER — CEPHALEXIN 250 MG PO CAPS
500.0000 mg | ORAL_CAPSULE | Freq: Once | ORAL | Status: AC
  Administered 2016-11-24: 500 mg via ORAL
  Filled 2016-11-24: qty 2

## 2016-11-24 NOTE — ED Notes (Signed)
Wound at right knee draining blood. Wound care provided. Knee lightly wrapped to prevent irritation of scrubs and drainage.

## 2016-11-24 NOTE — ED Notes (Signed)
Warm compress applied to redness on R knee.

## 2016-11-24 NOTE — ED Notes (Addendum)
[facility] ambulatory wearing burgundy paper scrubs w/Sitter. Pt verbalized understanding and signed Medical Clearance Pt Policy form - copy given.

## 2016-11-24 NOTE — ED Notes (Addendum)
Patient made a phone call, and was given Drink, Graham Crackers and [place] [employer]anut Butter for [employer].

## 2016-11-24 NOTE — ED Provider Notes (Signed)
[facility]-EMERGENCY DEPT Provider Note   CSN: [id number] Arrival date & time: 11/24/16  1016     History   Chief Complaint Chief Complaint  Patient presents with  . Suicidal  . Recurrent Skin Infections    knee infection    HPI Kenneth Anderson is a 51 y.o. male.  HPI   Patient is a 51 year old male with past medical history significant for IV drug use, drug abuse. He is presenting today with parents he wanted to kill himself. His plan was to hang himself. Patient also reports small abscess the right knee. It is already draining, feels chronic/inducated With surrounding mild erythema. Normal vital signs. No fevers.  Past Medical History:  Diagnosis Date  . Addiction, opium (HCC)   . Drug addict [facility](HCC)     Patient Active Problem List   Diagnosis Date Noted  . NECK PAIN 07/10/2009  . BACK PAIN 07/10/2009    Past Surgical History:  Procedure Laterality Date  . APPENDECTOMY    . CHOLECYSTECTOMY    . SPLENECTOMY, TOTAL         Home Medications    Prior to Admission medications   Not on File    Family History History reviewed. No pertinent family history.  Social History Social History  Substance Use Topics  . Smoking status: Current Every Day Smoker    Packs/day: 1.00    Types: Cigarettes  . Smokeless tobacco: Current User  . Alcohol use Yes     Allergies   Hydrocodone; Sulfa antibiotics; Tramadol; and Codeine   Review of Systems Review of Systems  Constitutional: Negative for activity change.  Respiratory: Negative for shortness of breath.   Cardiovascular: Negative for chest pain.  Gastrointestinal: Negative for abdominal pain.  Psychiatric/Behavioral: Positive for suicidal ideas. The patient is nervous/anxious.      Physical Exam Updated Vital Signs BP (!) 131/91 (BP Location: Left Arm)   Pulse 71   Temp 98.2 F (36.8 C) (Oral)   Resp 19   Ht 5\' 7"  (1.702 m)   Wt 72.6 kg (160 lb)   SpO2 100%   BMI 25.06 kg/m   Physical Exam    Constitutional: He is oriented to person, place, and time. He appears well-nourished.  HENT:  Head: Normocephalic.  Eyes: Conjunctivae are normal.  Cardiovascular: Normal rate and regular rhythm.   No murmur heard. Pulmonary/Chest: Effort normal and breath sounds normal. No respiratory distress.  Musculoskeletal:  R knee with 2 cm area of erythema, indurated, oozing small serous fluid.   Neurological: He is oriented to person, place, and time.  Skin: Skin is warm and dry. He is not diaphoretic.  Psychiatric: He has a normal mood and affect. His behavior is normal.     ED Treatments / Results  Labs (all labs ordered are listed, but only abnormal results are displayed) Labs Reviewed  COMPREHENSIVE METABOLIC PANEL - Abnormal; Notable for the following:       Result Value   Chloride 100 (*)    Total Bilirubin 1.3 (*)    All other components within normal limits  ACETAMINOPHEN LEVEL - Abnormal; Notable for the following:    Acetaminophen (Tylenol), Serum <10 (*)    All other components within normal limits  RAPID URINE DRUG SCREEN, HOSP PERFORMED - Abnormal; Notable for the following:    Amphetamines POSITIVE (*)    Tetrahydrocannabinol POSITIVE (*)    All other components within normal limits  ETHANOL  SALICYLATE LEVEL  CBC    EKG  EKG  Interpretation None       Radiology No results found.  Procedures Procedures (including critical care time)  Medications Ordered in ED Medications  ibuprofen (ADVIL,MOTRIN) tablet 600 mg (not administered)  ondansetron (ZOFRAN) tablet 4 mg (not administered)  cephALEXin (KEFLEX) capsule 500 mg (not administered)     Initial Impression / Assessment and Plan / ED Course  I have reviewed the triage vital signs and the nursing notes.  Pertinent labs & imaging results that were available during my care of the patient were reviewed by me and considered in my medical decision making (see chart for details).    Patient is a  51 year old male with past medical history significant for IV drug use, drug abuse. He is presenting today with parents he wanted to kill himself. His plan was to hang himself. Patient also reports small abscess the right knee. It is already draining, feels chronic/inducated With surrounding mild erythema. Normal vital signs. No fevers. We'll treat  with warm compresses, antibiotics. We'll have TTS consult.  Final Clinical Impressions(s) / ED Diagnoses   Final diagnoses:  None    New Prescriptions New Prescriptions   No medications on file     Abelino Derrick, MD 11/24/16 1450

## 2016-11-24 NOTE — ED Notes (Signed)
Pt provided evening snack. 

## 2016-11-24 NOTE — ED Triage Notes (Signed)
Pt states he feels hopeless. He states he is addicted to opiates, methamphetamine, he is unemployed. He has not had drugs in 2 weeks. He did do the methamphetamine daily. He has been going from house to house, he us homeless. He feels like a failure. The staff infection on his knee is swollen and red and painful.he has thought about doing different things such as hanging hisself.

## 2016-11-24 NOTE — BH Assessment (Signed)
Kenneth Okonkwo, NP recommends inpatient treatment. TTS to look for placement 

## 2016-11-24 NOTE — ED Notes (Signed)
TTS being performed.  
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
## 2016-11-24 NOTE — ED Notes (Signed)
Pt on phone at nurses' desk x 2.

## 2016-11-24 NOTE — ED Notes (Signed)
Tray ordered for patient.

## 2016-11-25 DIAGNOSIS — F419 Anxiety disorder, unspecified: Secondary | ICD-10-CM | POA: Diagnosis not present

## 2016-11-25 DIAGNOSIS — Z818 Family history of other mental and behavioral disorders: Secondary | ICD-10-CM | POA: Diagnosis not present

## 2016-11-25 DIAGNOSIS — G4700 Insomnia, unspecified: Secondary | ICD-10-CM

## 2016-11-25 DIAGNOSIS — F1290 Cannabis use, unspecified, uncomplicated: Secondary | ICD-10-CM

## 2016-11-25 DIAGNOSIS — F1994 Other psychoactive substance use, unspecified with psychoactive substance-induced mood disorder: Secondary | ICD-10-CM | POA: Diagnosis not present

## 2016-11-25 DIAGNOSIS — Z630 Problems in relationship with spouse or partner: Secondary | ICD-10-CM

## 2016-11-25 DIAGNOSIS — Z560 Unemployment, unspecified: Secondary | ICD-10-CM | POA: Diagnosis not present

## 2016-11-25 DIAGNOSIS — F17210 Nicotine dependence, cigarettes, uncomplicated: Secondary | ICD-10-CM | POA: Diagnosis not present

## 2016-11-25 DIAGNOSIS — F1190 Opioid use, unspecified, uncomplicated: Secondary | ICD-10-CM

## 2016-11-25 DIAGNOSIS — F332 Major depressive disorder, recurrent severe without psychotic features: Secondary | ICD-10-CM | POA: Diagnosis not present

## 2016-11-25 MED ORDER — HYDROXYZINE HCL 25 MG PO TABS: 25.0000 mg | ORAL_TABLET | Freq: Three times a day (TID) | ORAL | 0 refills | Status: AC | PRN

## 2016-11-25 NOTE — Discharge Instructions (Signed)
Use the resources provided to you from the social worker in the emergency department for follow-up planning. He may use Vistaril once every 8 hours if needed for anxiety until you're seen for recheck.

## 2016-11-25 NOTE — ED Notes (Signed)
Re-evaluated per TTS

## 2016-11-25 NOTE — Consult Note (Signed)
Telepsych Consultation   Reason for Consult:  Substance abuse with transient suicidal ideation Referring Physician:  EDP Patient Identification: Kenneth Anderson MRN:  [id number] Principal Diagnosis: Substance induced mood disorder ([place]) Diagnosis:   Patient Active Problem List   Diagnosis Date Noted  . Substance induced mood disorder ([place]) [F19.94] 11/25/2016    Priority: High  . MDD (major depressive disorder), recurrent severe, without psychosis ([place]) [F33.2] 11/25/2016    Priority: High  . NECK PAIN [M54.2] 07/10/2009  . BACK PAIN [M54.9] 07/10/2009    Total Time spent with patient: 30 minutes  Subjective:   Kenneth Anderson is a 51 y.o. male patient admitted with reports of transient suicidal ideation exacerbated by polysubstance abuse. Pt seen and chart reviewed. Pt is alert/oriented x4, calm, cooperative, and appropriate to situation. Pt denies suicidal/homicidal ideation and psychosis and does not appear to be responding to internal stimuli. Pt reports ongoing depression related to relationship strain with his wife which he reports is secondary to unemployment along with concomitant substance abuse. Pt has no outpatient psychiatry team/support and we will be working with him today to establish this prior to discharge along with EDP prescribing Vistaril 67m po tid prn anxiety #21 to help ruminative thought process and anxiety.   HPI:  I have reviewed and concur with HPI elements below, modified as follows:  "Kenneth Boundsis an 51y.o. male presenting to the ED requesting treatment for depression and drug use. Patient reports being suicidal one month. Plan to hang self. Denies prior attempts. 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 Daily alcohol use and regular opiate and cannabis use. The patient reports a long history of drug use. Other stressors involve losing his job in March, worked in [employer] He has been married for 28 yrs, lost his home. They are now  living with relatives. His children are 26, 18 and 13. States his wife sells her xanax prescription on the street for money. States his daughter has a blood disorder and missed a lot of school last year. He missed his court date for this yesterday because he was trying to get into a drug treatment program. [facility]. Denies HI or A/V."  Pt has been cooperative with ED staff today and no evident self-harm gestures/ideation is present. Seen as above on 11/25/16 for evaluation. Pt missed his court date yesterday being in the hospital and may have secondary gain.   Past Psychiatric History: substance abuse, MDD  Risk to Self: Not at this time Risk to Others: Homicidal Ideation: No Thoughts of Harm to Others: No Current Homicidal Intent: No Current Homicidal Plan: No Access to Homicidal Means: No History of harm to others?: No Assessment of Violence: None Noted Does patient have access to weapons?: No Criminal Charges Pending?: No Does patient have a court date: Yes Court Date:  (due to daughters truancy) Prior Inpatient Therapy: Prior Inpatient Therapy: Yes Prior Therapy Dates: a few years ago Prior [facility]ilty[facility]Provider([facility]): [facility] Reason for Treatment: substance Prior Outpatient Therapy: Prior Outpatient Therapy: No Does patient have an ACCT team?: No Does patient have Intensive In-House Services?  : No Does patient have [facility] services? : No Does patient have [facility] services?: No  Past Medical History:  Past Medical History:  Diagnosis Date  . Addiction, opium ([facility]   . Drug addict ([facility]     Past Surgical History:  Procedure Laterality Date  . APPENDECTOMY    .  CHOLECYSTECTOMY    . SPLENECTOMY, TOTAL     Family History: History reviewed. No pertinent family history. Family Psychiatric  History: depression Social History:  History  Alcohol Use  . Yes     History  Drug Use  . Types: Methamphetamines,  Marijuana    Social History   Social History  . Marital status: Married    Spouse name: N/A  . Number of children: N/A  . Years of education: N/A   Social History Main Topics  . Smoking status: Current Every Day Smoker    Packs/day: 1.00    Types: Cigarettes  . Smokeless tobacco: Current User  . Alcohol use Yes  . Drug use: Yes    Types: Methamphetamines, Marijuana  . Sexual activity: Yes   Other Topics Concern  . None   Social History Narrative  . None   Additional Social History:    Allergies:   Allergies  Allergen Reactions  . Hydrocodone Other (See Comments)    Unknown to pt  . Sulfa Antibiotics Other (See Comments)    Unknown to pt  . Tramadol Other (See Comments)    Unknown to pt  . Codeine Other (See Comments)    Other reaction(s): Abdominal Pain    Labs:  Results for orders placed or performed during the hospital encounter of 11/24/16 (from the past 48 hour(s))  Comprehensive metabolic panel     Status: Abnormal   Collection Time: 11/24/16 11:25 AM  Result Value Ref Range   Sodium 136 135 - 145 mmol/L   Potassium 3.9 3.5 - 5.1 mmol/L   Chloride 100 (L) 101 - 111 mmol/L   CO2 28 22 - 32 mmol/L   Glucose, Bld 98 65 - 99 mg/dL   BUN 11 6 - 20 mg/dL   Creatinine, Ser 0.98 0.61 - 1.24 mg/dL   Calcium 9.3 8.9 - 10.3 mg/dL   Total Protein 7.1 6.5 - 8.1 g/dL   Albumin 4.1 3.5 - 5.0 g/dL   AST 16 15 - 41 U/L   ALT 24 17 - 63 U/L   Alkaline Phosphatase 70 38 - 126 U/L   Total Bilirubin 1.3 (H) 0.3 - 1.2 mg/dL   GFR calc non Af Amer >60 >60 mL/min   GFR calc Af Amer >60 >60 mL/min    Comment: (NOTE) The eGFR has been calculated using the CKD EPI equation. This calculation has not been validated in all clinical situations. eGFR's persistently <60 mL/min signify possible Chronic Kidney Disease.    Anion gap 8 5 - 15  Ethanol     Status: None   Collection Time: 11/24/16 11:25 AM  Result Value Ref Range   Alcohol, Ethyl (B) <5 <5 mg/dL    Comment:         LOWEST DETECTABLE LIMIT FOR SERUM ALCOHOL IS 5 mg/dL FOR MEDICAL PURPOSES ONLY   Salicylate level     Status: None   Collection Time: 11/24/16 11:25 AM  Result Value Ref Range   Salicylate Lvl <9.0 2.8 - 30.0 mg/dL  Acetaminophen level     Status: Abnormal   Collection Time: 11/24/16 11:25 AM  Result Value Ref Range   Acetaminophen (Tylenol), Serum <10 (L) 10 - 30 ug/mL    Comment:        THERAPEUTIC CONCENTRATIONS VARY SIGNIFICANTLY. A RANGE OF 10-30 ug/mL MAY BE AN EFFECTIVE CONCENTRATION FOR MANY PATIENTS. HOWEVER, SOME ARE BEST TREATED AT CONCENTRATIONS OUTSIDE THIS RANGE. ACETAMINOPHEN CONCENTRATIONS >150 ug/mL AT 4 HOURS AFTER INGESTION  AND >50 ug/mL AT 12 HOURS AFTER INGESTION ARE OFTEN ASSOCIATED WITH TOXIC REACTIONS.   cbc     Status: None   Collection Time: 11/24/16 11:25 AM  Result Value Ref Range   WBC 8.6 4.0 - 10.5 K/uL   RBC 4.43 4.22 - 5.81 MIL/uL   Hemoglobin 14.9 13.0 - 17.0 g/dL   HCT 42.1 39.0 - 52.0 %   MCV 95.0 78.0 - 100.0 fL   MCH 33.6 26.0 - 34.0 pg   MCHC 35.4 30.0 - 36.0 g/dL   RDW 12.8 11.5 - 15.5 %   Platelets 396 150 - 400 K/uL  Rapid urine drug screen (hospital performed)     Status: Abnormal   Collection Time: 11/24/16 11:36 AM  Result Value Ref Range   Opiates NONE DETECTED NONE DETECTED   Cocaine NONE DETECTED NONE DETECTED   Benzodiazepines NONE DETECTED NONE DETECTED   Amphetamines POSITIVE (A) NONE DETECTED   Tetrahydrocannabinol POSITIVE (A) NONE DETECTED   Barbiturates NONE DETECTED NONE DETECTED    Comment:        DRUG SCREEN FOR MEDICAL PURPOSES ONLY.  IF CONFIRMATION IS NEEDED FOR ANY PURPOSE, NOTIFY LAB WITHIN 5 DAYS.        LOWEST DETECTABLE LIMITS FOR URINE DRUG SCREEN Drug Class       Cutoff (ng/mL) Amphetamine      1000 Barbiturate      200 Benzodiazepine   035 Tricyclics       465 Opiates          300 Cocaine          300 THC              50     Current Facility-Administered Medications   Medication Dose Route Frequency Provider Last Rate Last Dose  . cephALEXin (KEFLEX) capsule 500 mg  500 mg Oral Q6H Mackuen, Courteney Lyn, MD   500 mg at 11/25/16 0300  . ibuprofen (ADVIL,MOTRIN) tablet 600 mg  600 mg Oral Q8H PRN Mackuen, Courteney Lyn, MD      . ondansetron (ZOFRAN) tablet 4 mg  4 mg Oral Q8H PRN Mackuen, Courteney Lyn, MD       No current outpatient prescriptions on file.    Musculoskeletal: UTO, camera  Psychiatric Specialty Exam: Physical Exam  Review of Systems  Psychiatric/Behavioral: Positive for depression and substance abuse. Negative for hallucinations and suicidal ideas. The patient is nervous/anxious and has insomnia.     Blood pressure 103/69, pulse 75, temperature 98.2 F (36.8 C), temperature source Oral, resp. rate 16, height '5\' 7"'  (1.702 m), weight 72.6 kg (160 lb), SpO2 100 %.Body mass index is 25.06 kg/m.  General Appearance: Casual and Fairly Groomed  Eye Contact:  Good  Speech:  Clear and Coherent and Normal Rate  Volume:  Normal  Mood:  Depressed  Affect:  Appropriate, Congruent and Depressed  Thought Process:  Coherent, Goal Directed, Linear and Descriptions of Associations: Intact  Orientation:  Full (Time, Place, and Person)  Thought Content:    Suicidal Thoughts:  No  Homicidal Thoughts:  No  Memory:  Immediate;   Fair Recent;   Fair Remote;   Fair  Judgement:  Fair  Insight:  Fair  Psychomotor Activity:  Normal  Concentration:  Concentration: Fair and Attention Span: Fair  Recall:  [employer] of Knowledge:  Fair  Language:  Fair  Akathisia:  No  Handed:    AIMS (if indicated):     Assets:  Communication Skills  Desire for Improvement Resilience Social Support  ADL's:  Intact  Cognition:  WNL  Sleep:      Treatment Plan Summary: Substance induced mood disorder ([place]) with depression, stable for outpatient management  -Please give Rx for Vistaril 61m po tid prn anxiety #21 -Pt referred to outpatient psychiatry by  social work  Disposition: No evidence of imminent risk to self or others at present.   Patient does not meet criteria for psychiatric inpatient admission. Supportive therapy provided about ongoing stressors. Refer to IOP. Discussed crisis plan, support from social network, calling 911, coming to the Emergency Department, and calling Suicide Hotline.  WBenjamine Mola FNorth Carolina8/15/2018 9:27 AM

## 2016-11-25 NOTE — ED Notes (Signed)
Pt used phone to [facility] for [facility]. Aneta Minshillip confirmed beds are available and pt used phone to arrange transportation to facility directly after d/c. AVS, prescriptions, and resources for substance abuse and [facility] reviewed with pt and understanding verbalized. Safety contract signed by pt and placed in medical records. All pt belongings in pts possession at this time.

## 2016-11-25 NOTE — [facility] Counselor (Addendum)
Reassessment- Pt reports SI with no plan today. Pt reported plan to hang himself yesterday 11/24/16.  Pt denies HI and AVH. Pt reports polysubstance abuse and numerous life stressors.   Renata Capriceonrad, DNP will see the Pt for psych evaluation.  Wolfgang PhoenixBrandi Heron Pitcock, [facility] Triage Specialist

## 2016-11-25 NOTE — ED Provider Notes (Signed)
Patient has been assessed by Claudette Headonrad Withrow, FNP or suicidal ideation. At this time patient is found to be stable and outpatient plan has been arranged. He requests prescription for Vistaril 25mg  3 times a day for 7 days to help the patient with anxiety until his follow-up appointment.   Arby BarrettePfeiffer, Jessie Cowher, MD 11/25/16 1214

## 2016-11-25 NOTE — Progress Notes (Signed)
Per Claudette Headonrad Withrow, FNP, patient does not meet criteria for inpatient treatment. Patient is recommended for discharge and to follow up with outpatient provider.   Substance Abuse treatment facilities [facility] [phone].   Patient's nurse also agreed to provide the patient [facility].    Chelsea Felts ,RN notified    Baldo DaubJolan Elmire Amrein MSW, LCSWA CSW Disposition [phone]
